# Patient Record
Sex: Male | Born: 1975 | Race: White | Hispanic: No | Marital: Married | Smoking: Former smoker
Health system: Southern US, Community
[De-identification: ages and names within clinical notes are randomized; demographics above are authoritative.]

## PROBLEM LIST (undated history)

## (undated) DIAGNOSIS — Q796 Ehlers-Danlos syndrome, unspecified: Secondary | ICD-10-CM

## (undated) DIAGNOSIS — I10 Essential (primary) hypertension: Secondary | ICD-10-CM

## (undated) DIAGNOSIS — J45909 Unspecified asthma, uncomplicated: Secondary | ICD-10-CM

---

## 2006-11-05 ENCOUNTER — Emergency Department: Payer: Self-pay | Admitting: Unknown Physician Specialty

## 2009-03-05 ENCOUNTER — Encounter (INDEPENDENT_AMBULATORY_CARE_PROVIDER_SITE_OTHER): Payer: Self-pay | Admitting: Orthopedic Surgery

## 2009-03-05 ENCOUNTER — Ambulatory Visit (HOSPITAL_BASED_OUTPATIENT_CLINIC_OR_DEPARTMENT_OTHER): Admission: RE | Admit: 2009-03-05 | Discharge: 2009-03-05 | Payer: Self-pay | Admitting: Orthopedic Surgery

## 2010-03-20 ENCOUNTER — Emergency Department (HOSPITAL_COMMUNITY): Admission: EM | Admit: 2010-03-20 | Discharge: 2010-03-21 | Payer: Self-pay | Admitting: Emergency Medicine

## 2010-03-20 ENCOUNTER — Emergency Department (HOSPITAL_COMMUNITY): Admission: EM | Admit: 2010-03-20 | Discharge: 2010-03-20 | Payer: Self-pay | Admitting: Family Medicine

## 2011-04-11 NOTE — Op Note (Signed)
NAME:  [name], MAN            ACCOUNT NO.:  [id number]   MEDICAL RECORD NO.:  [id number]          PATIENT TYPE:  AMB   LOCATION:  DSC                          FACILITY:  [facility]   PHYSICIAN:  Katy Fitch. Sypher, M.D. 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  Also preoperative plain x-rays demonstrated a lytic lesion in the distal  phalanx, rule out chronic epidermal inclusion cyst versus glomus tumor  versus other expansile lesion.   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   OPERATION:  1. Permanent resection of deformed ulnar nail fold, nail walls and      nail plate.  2. Biopsy of interosseous lesion left long finger distal phalanx.  3. Biopsy of subungual lesion, rule out glomus tumor versus granuloma.   SURGEON:  Katy Fitch. Sypher, MD   ASSISTANT:  None.   ANESTHESIA:  Lidocaine 2% metacarpal head level block left long finger  supplemented by IV sedation.   SUPERVISING ANESTHESIOLOGIST:  Zenon Mayo, MD   INDICATIONS:  Taylor Wilcox is a 35 year old gentleman referred  through the courtesy of Dr. Wynelle Link for evaluation of a chronically deformed  and draining left long finger nail.   In August 2008, [name] crushed the fingertip and was treated at  an emergency facility.  He subsequently developed a very odd nail  deformity and had chronic drainage from beneath the nail fold.  He had  intermittent pain.  He was referred for Hand Surgery consult.  Clinical  examination revealed a very deformed folded nail plate on the ulnar  aspect of the left long finger nail fold.  He had a scar at the dorsal  nail fold, a 180-degree fold in the nail plate  and chronic drainage of  foul material from beneath the nail fold.  An x-ray of the finger  documented a significant area of lysis in the distal phalanx just above  an expansile mass.   We advised proper biopsy of the distal phalanx nail wall, possible  cultures and management of the deformed nail plate as our findings as  Surgery indicated.  After informed consent, [name] was brought  to the operating room at this time.   PROCEDURE:  [name] was brought to the operating room and  placed in supine position upon the operating table.   Following an Anesthesia consult, monitored anesthesia care was  recommended and accepted.  [name] was brought to room 1 of the  [facility] and placed in supine position upon the operating  table and under Dr. Jarrett Ables supervision sedation provided.  The  left hand was prepped with Betadine soap solution and sterilely draped.  A 2% lidocaine metacarpal head level block was placed with a total  volume of 6 mL of 2% lidocaine.  The left long finger was exsanguinated  with a  gauze wrap and a 1/2-inch wide strip of Esmarch bandage used as a  digital tourniquet.  The procedure commenced with a meticulous resection  of the ulnar 25% of the nail plate that included all of the deformed  nail plate, the germinal nail matrix including the dorsal intermediate  and ventral nail fold down to the distal phalanx.  The germinal nail  matrix was in continuity with the osseous lesion.  The osseous lesion  was fairly avascular, encapsulated and likely a form of an epidermal or  dermal inclusion cyst.  The entire deformed nail and cyst was resected  and the biopsy was sent in separate portions with the deformed nail as  one biopsy specimen, the encapsulated bony lesion as a second specimen  in formalin and after elevation of the nail plate, a third shiny mass  was resected that could be a glomus tumor or could represent an atypical   granuloma.   After complete debridement of the nail fold and removal of all nail  elements from the distal phalanx, the wound was partially repaired with  mattress suture of 5-0 chromic.   A drain was placed into the bony defect to allow egress of hematoma and  other effluent.   The wound was dressed open with Xeroflo sterile gauze and a Coban finger  dressing.   There were no apparent complications.   [name] tolerated the surgery and anesthesia well.  He was  awakened from sedation and transferred to the recovery room with stable  signs.   For aftercare, he will be provided prescription for Percocet 5 mg one  p.o. q.4-6 h. p.r.n. pain.  Also, he will be protected with  perioperative antibiotics in the form of doxycycline 100 mg p.o. b.i.d.  x5 days as a prophylactic antibiotic.      Katy Fitch Sypher, M.D.  Electronically Signed     RVS/MEDQ  D:  03/05/2009  T:  03/05/2009  Job:  [id number]   cc:   Dr. Evert Kohl

## 2019-12-10 ENCOUNTER — Other Ambulatory Visit: Payer: Self-pay

## 2019-12-10 ENCOUNTER — Encounter (HOSPITAL_COMMUNITY): Payer: Self-pay

## 2019-12-10 ENCOUNTER — Ambulatory Visit (HOSPITAL_COMMUNITY)
Admission: EM | Admit: 2019-12-10 | Discharge: 2019-12-10 | Disposition: A | Discharge status: Home / Self Care | Payer: 59

## 2019-12-10 DIAGNOSIS — M79602 Pain in left arm: Secondary | ICD-10-CM

## 2019-12-10 HISTORY — DX: Ehlers-Danlos syndrome, unspecified: Q79.60

## 2019-12-10 MED ORDER — NAPROXEN 500 MG PO TABS: 500.0000 mg | ORAL_TABLET | Freq: Two times a day (BID) | ORAL | 0 refills | Status: AC

## 2019-12-10 NOTE — ED Triage Notes (Signed)
Pt c/o right anterior arm pain at antecubital area intermittently x3-4 weeks. Reoccurred yesterday; upon awakening today numbness, tingling, feeling "asleep" up to shoulder; chest muscle area left area feels "tight" with mild lightheadedness 2 hours ago. States cp unchanged with inspiration/movement; "heaviness" remains. Denies n/v or diaphoresis.  EKG performed and result given to Dahlia Byes, NP

## 2019-12-10 NOTE — Discharge Instructions (Addendum)
Your EKG and exam were reassuring today.  Your EKG was normal No concerns for blood clot today. This is most likely some sort of nerve issue Try doing the naproxen twice a day for the next week or so to see if this helps. Avoid repetitive movements with the wrist, hands to see if this helps. Stretching and taking breaks when using the computer may help As we spoke about if your symptoms worsen and you start having worsening chest pain, dizziness, nausea, diaphoresis or shortness of breath you need to go to the ER.

## 2019-12-11 NOTE — ED Provider Notes (Signed)
[facility]    CSN: [id number] Arrival date & time: 12/10/19  1234      History   Chief Complaint Chief Complaint  Patient presents with  . Chest Pain    HPI Taylor Wilcox is a 44 y.o. male.   Patient is an otherwise healthy 44 year old male that presents today with sharp pain in the left antecubital area that has been there intermittently over the last 3 to 4 weeks.  Reoccurred yesterday upon waking and reporting now the pain is radiating up to the left arm, shoulder and into left chest area.  This pain still comes and goes.  He has had some associated numbness, tingling and a sensation of arm falling asleep.  Chest feels tight.  The chest pain is not worsened by movement, breathing.  No associated nausea, vomiting, diaphoresis, shortness of breath.  Has been doing a lot of upper body movements with remodeling of his bathroom recently.  Also works at a computer and types daily.  No swelling to the arm, redness.  No history of DVT or PE.  No recent long distance traveling.  No cough, chest congestion, fevers.  ROS per HPI    Chest Pain   Past Medical History:  Diagnosis Date  . Ehlers-Danlos syndrome     There are no problems to display for this patient.   History reviewed. No pertinent surgical history.     Home Medications    Prior to Admission medications   Medication Sig Start Date End Date Taking? Authorizing Provider  Multiple Vitamin (MULTIVITAMIN) tablet Take 1 tablet by mouth daily.   Yes [provider]  naproxen (NAPROSYN) 500 MG tablet Take 1 tablet (500 mg total) by mouth 2 (two) times daily. 12/10/19   Janace Aris, NP    Family History Family History  Problem Relation Age of Onset  . Aneurysm Mother   . Hyperlipidemia Father   . Hypertension Father     Social History Social History   Tobacco Use  . Smoking status: Former Smoker    Packs/day: 0.50    Years: 10.00    Pack years: 5.00    Quit date: 2010    Years  since quitting: 11.0  . Smokeless tobacco: Never Used  Substance Use Topics  . Alcohol use: Yes    Alcohol/week: 3.0 standard drinks    Types: 3 Glasses of wine per week  . Drug use: Never     Allergies   Patient has no allergy information on record.   Review of Systems Review of Systems  Cardiovascular: Positive for chest pain.     Physical Exam Triage Vital Signs ED Triage Vitals  Enc Vitals Group     BP 12/10/19 1310 (!) 151/102     Pulse Rate 12/10/19 1310 78     Resp 12/10/19 1310 16     Temp 12/10/19 1310 98.3 F (36.8 C)     Temp Source 12/10/19 1310 Oral     SpO2 12/10/19 1310 100 %     Weight --      Height --      Head Circumference --      Peak Flow --      Pain Score 12/10/19 1320 3     Pain Loc --      Pain Edu? --      Excl. in GC? --    No data found.  Updated Vital Signs BP (!) 151/102 (BP Location: Right Arm)   Pulse 78  Temp 98.3 F (36.8 C) (Oral)   Resp 16   SpO2 100%   Visual Acuity Right Eye Distance:   Left Eye Distance:   Bilateral Distance:    Right Eye Near:   Left Eye Near:    Bilateral Near:     Physical Exam Vitals reviewed.  Constitutional:      General: He is not in acute distress.    Appearance: He is well-developed. He is not ill-appearing, toxic-appearing or diaphoretic.  HENT:     Head: Normocephalic.  Cardiovascular:     Rate and Rhythm: Normal rate and regular rhythm.     Heart sounds: Normal heart sounds.  Pulmonary:     Effort: Pulmonary effort is normal.     Breath sounds: Normal breath sounds.  Musculoskeletal:        General: Normal range of motion.     Cervical back: Normal range of motion.     Right lower leg: No tenderness. No edema.     Left lower leg: No tenderness. No edema.     Comments: Arm appears normal in color and without swelling. Good range of motion of shoulder, elbow, wrist Nontender to palpation of chest wall, upper back Mild tenderness to left antecubital area Neurovascular  intact Strength 5 out of 5 in upper extremities   Skin:    General: Skin is warm and dry.  Neurological:     General: No focal deficit present.     Mental Status: He is alert.  Psychiatric:        Mood and Affect: Mood normal.      UC Treatments / Results  Labs (all labs ordered are listed, but only abnormal results are displayed) Labs Reviewed - No data to display  EKG   Radiology No results found.  Procedures Procedures (including critical care time)  Medications Ordered in UC Medications - No data to display  Initial Impression / Assessment and Plan / UC Course  I have reviewed the triage vital signs and the nursing notes.  Pertinent labs & imaging results that were available during my care of the patient were reviewed by me and considered in my medical decision making (see chart for details).     Otherwise healthy 44 year old male presents with this intermittent, waxing and waning sharp pain with associated numbness and tingling in the left arm describes as "weird sensations,". On exam arm appears normal without swelling, redness, bruising. Good range of motion Neurovascularly intact Strength 5 out of 5 in extremity. Based on symptoms did EKG today which showed normal sinus rhythm and normal rate.  No concern for ACS at this time. Not concerned for DVT Most likely patient's pain is musculoskeletal with some associated nerve inflammation/impingment . Treatment ; we will try naproxen twice a day for pain inflammation. If the problem continues he may want to try a burst of steroids.  Recommended avoiding repetitive movements of the wrist and arm to see if this helps. Stretching and taking breaks while using a computer may help Strict return and ER precautions given that if symptoms continue he will need to go to the ER for further evaluation.  Patient understanding and agree. Pt reassured.   Final Clinical Impressions(s) / UC Diagnoses   Final diagnoses:  Pain  of left upper extremity     Discharge Instructions     Your EKG and exam were reassuring today.  Your EKG was normal No concerns for blood clot today. This is most likely some sort of  nerve issue Try doing the naproxen twice a day for the next week or so to see if this helps. Avoid repetitive movements with the wrist, hands to see if this helps. Stretching and taking breaks when using the computer may help As we spoke about if your symptoms worsen and you start having worsening chest pain, dizziness, nausea, diaphoresis or shortness of breath you need to go to the ER.     ED Prescriptions    Medication Sig Dispense Auth. Provider   naproxen (NAPROSYN) 500 MG tablet Take 1 tablet (500 mg total) by mouth 2 (two) times daily. 30 tablet Dahlia Byes A, NP     PDMP not reviewed this encounter.   Janace Aris, NP 12/11/19 1439

## 2020-09-14 ENCOUNTER — Other Ambulatory Visit: Payer: Self-pay | Admitting: Family Medicine

## 2020-09-14 DIAGNOSIS — Q796 Ehlers-Danlos syndrome, unspecified: Secondary | ICD-10-CM

## 2020-09-14 DIAGNOSIS — Z8249 Family history of ischemic heart disease and other diseases of the circulatory system: Secondary | ICD-10-CM

## 2020-09-27 ENCOUNTER — Encounter: Payer: Self-pay | Admitting: Family Medicine

## 2021-09-15 ENCOUNTER — Other Ambulatory Visit: Payer: Self-pay | Admitting: Family Medicine

## 2021-09-15 DIAGNOSIS — Q796 Ehlers-Danlos syndrome, unspecified: Secondary | ICD-10-CM

## 2021-09-15 DIAGNOSIS — Z8249 Family history of ischemic heart disease and other diseases of the circulatory system: Secondary | ICD-10-CM

## 2021-10-24 ENCOUNTER — Ambulatory Visit
Admission: RE | Admit: 2021-10-24 | Discharge: 2021-10-24 | Disposition: A | Discharge status: Home / Self Care | Payer: 59 | Source: Ambulatory Visit | Attending: Family Medicine | Admitting: Family Medicine

## 2021-10-24 ENCOUNTER — Other Ambulatory Visit: Payer: Self-pay

## 2021-10-24 ENCOUNTER — Other Ambulatory Visit: Payer: Self-pay | Admitting: Family Medicine

## 2021-10-24 DIAGNOSIS — Z8249 Family history of ischemic heart disease and other diseases of the circulatory system: Secondary | ICD-10-CM

## 2021-10-24 DIAGNOSIS — Q796 Ehlers-Danlos syndrome, unspecified: Secondary | ICD-10-CM

## 2022-12-04 IMAGING — MR MR MRA HEAD W/O CM
1 series · 10 of 48 positions shown · non-contrast
Comparison: None.

CLINICAL DATA: Ehlers-Danlos syndrome. Family history of cerebral
aneurysm.

EXAM:
MRI HEAD WITHOUT CONTRAST
MRA HEAD WITHOUT CONTRAST
TECHNIQUE: Multiplanar, multi-echo pulse sequences of the brain and surrounding
structures were acquired without intravenous contrast. Angiographic
images of the Circle of Willis were acquired using MRA technique
without intravenous contrast.

[Series 5: [id number] · axial · 0.6mm · 0.26mm/px · z∈[-56,+20]mm · 10 of 162 slices shown]
[im 11/162]
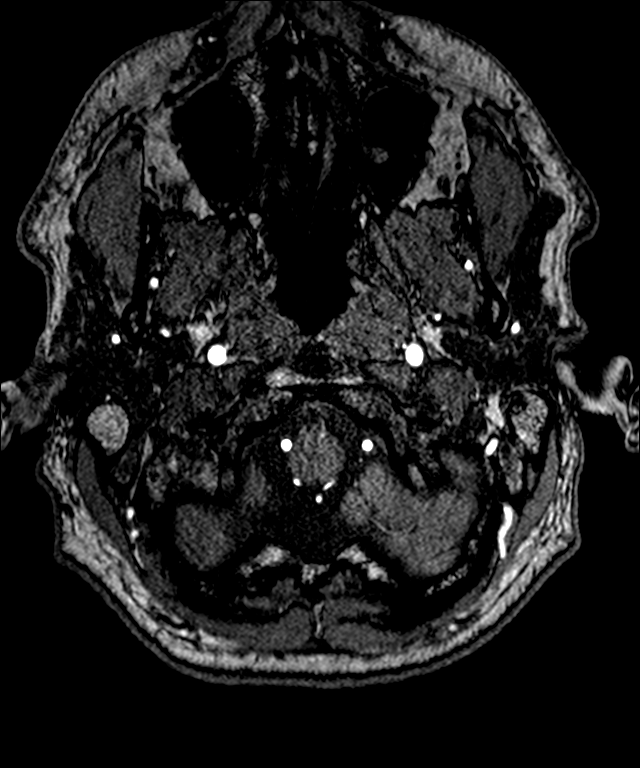
[im 28/162]
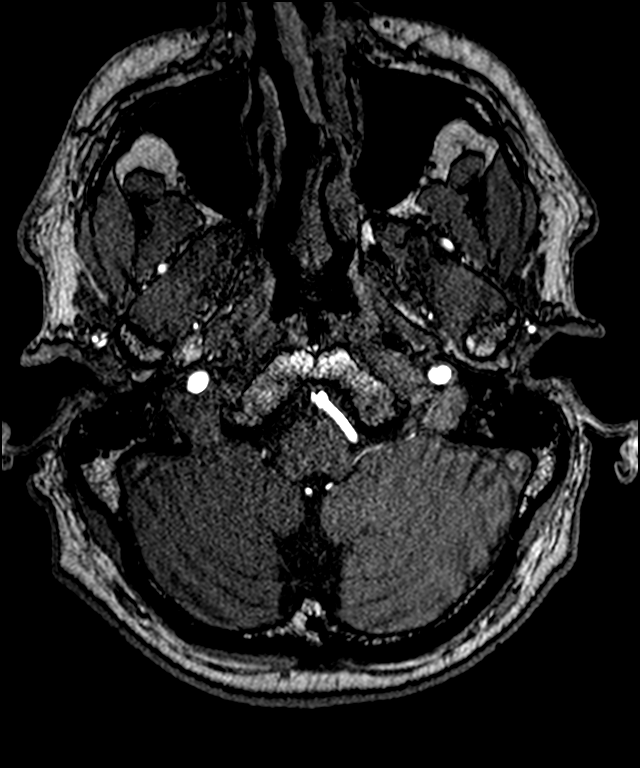
[im 31/162]
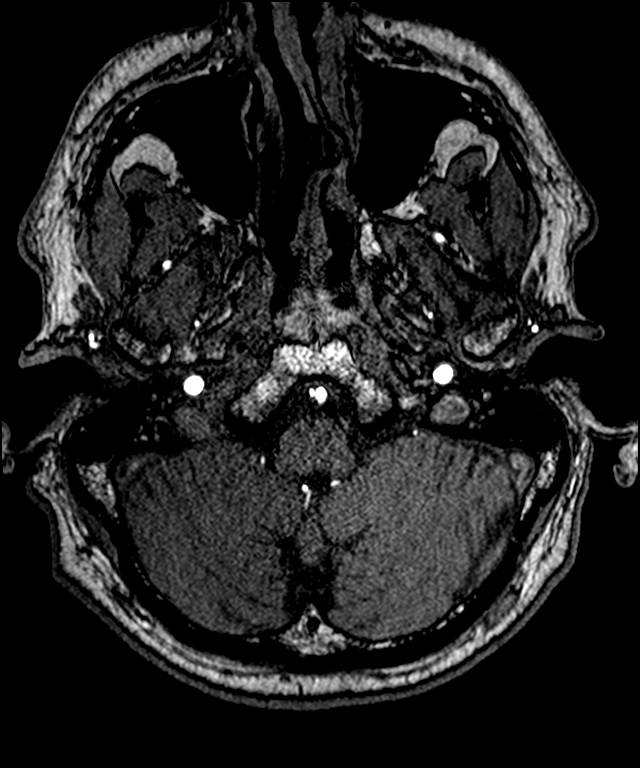
[im 52/162]
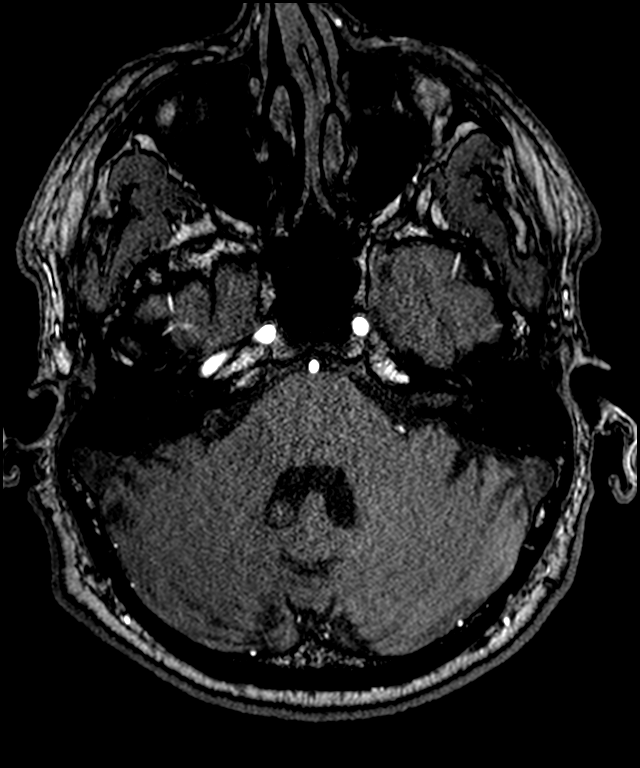
[im 72/162]
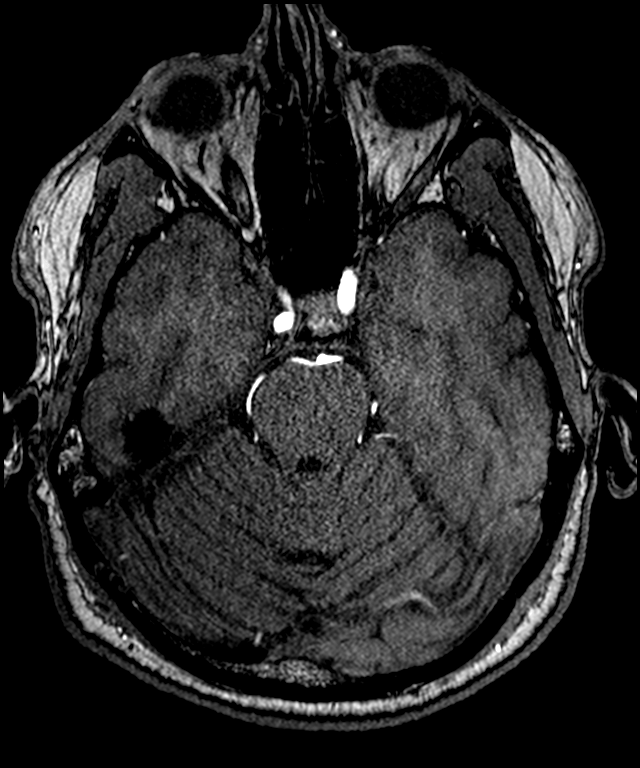
[im 83/162]
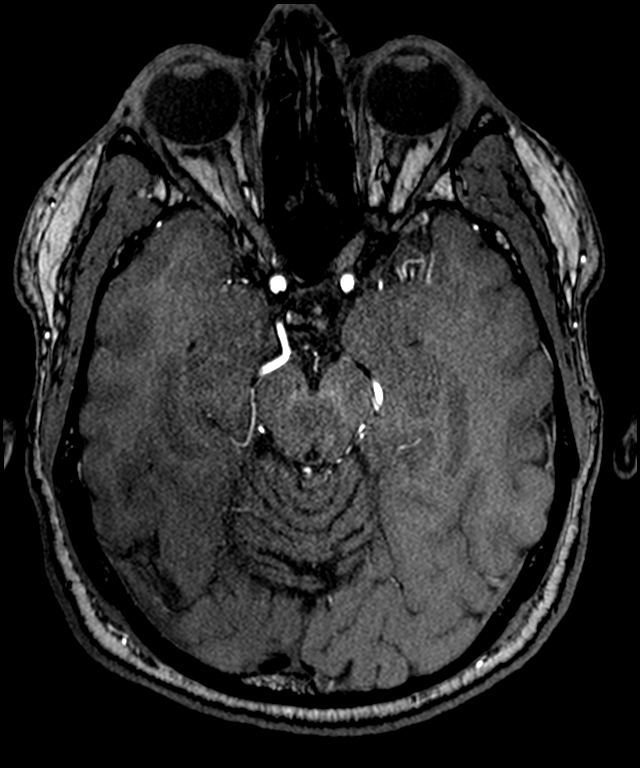
[im 93/162]
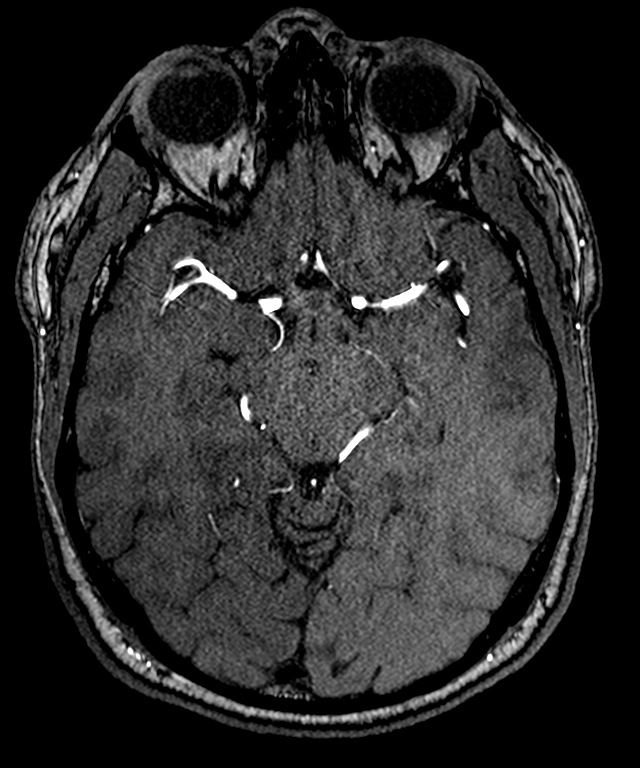
[im 114/162]
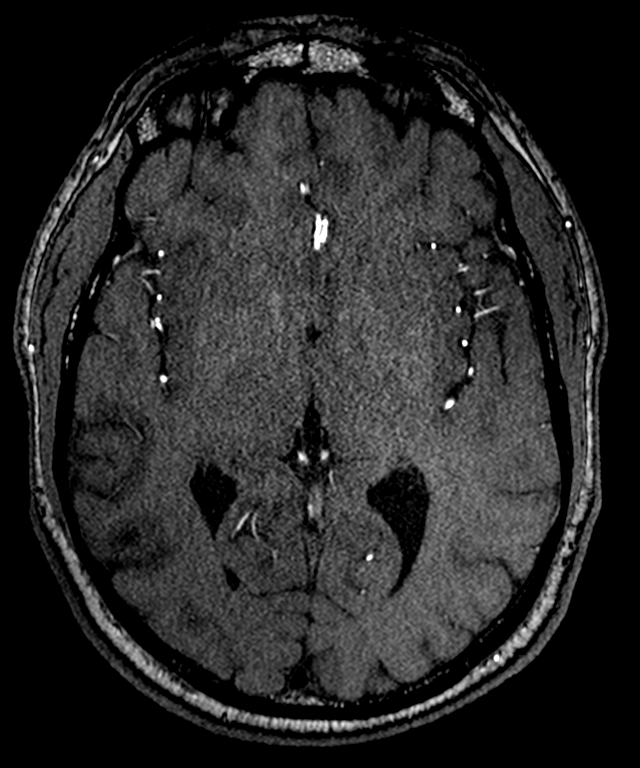
[im 134/162]
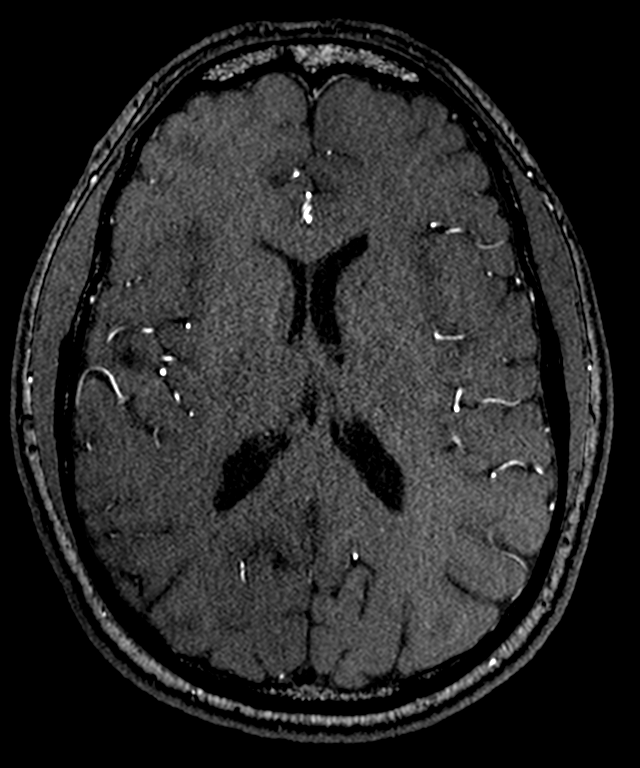
[im 138/162]
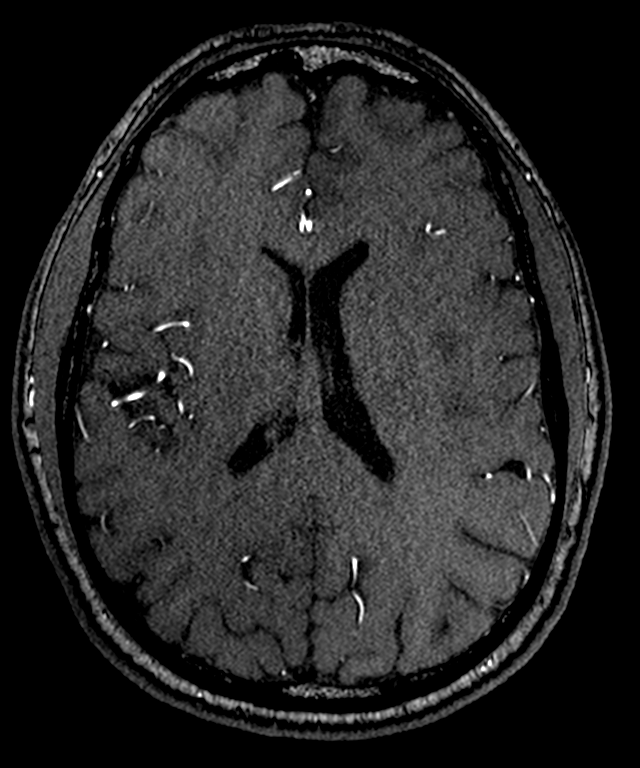

[10 of 48 positions shown; findings below may reference images not displayed]

FINDINGS: MRI HEAD FINDINGS

Brain: There is no evidence of an acute infarct, intracranial
hemorrhage, mass, midline shift, or extra-axial fluid collection.
The ventricles and sulci are normal. Scattered small T2
hyperintensities in the cerebral white matter bilaterally,
predominantly in the subcortical white matter of the frontal lobes,
are slightly advanced for age.

Vascular: Major intracranial vascular flow voids are preserved.

Skull and upper cervical spine: Unremarkable bone marrow signal.
Asymmetrically advanced right-sided facet arthrosis at C3-4 with
grade 1 anterolisthesis.

Sinuses/Orbits: Unremarkable orbits. Clear paranasal sinuses. Trace
left mastoid fluid.

Other: None.

MRA HEAD FINDINGS

Anterior circulation: The internal carotid arteries are widely
patent from skull base to carotid termini. ACAs and MCAs are patent
without evidence of a proximal branch occlusion or significant
proximal stenosis. No aneurysm is identified.

Posterior circulation: The included intracranial portions of both
vertebral arteries are widely patent to the basilar and codominant.
The right PICA and left AICA appear dominant. Patent SCAs are seen
bilaterally. The basilar artery is widely patent with an incidental
fenestration proximally. There are large right and small or absent
left posterior communicating arteries with hypoplasia of the right
P1 segment. Both PCAs are patent without evidence of a significant
proximal stenosis. No aneurysm is identified.

Anatomic variants: Fetal right PCA.
IMPRESSION: 1. No acute intracranial abnormality.
2. Mild cerebral white matter T2 hyperintensities, nonspecific but
may reflect early chronic small vessel ischemia, migraines, or prior
infection/inflammation.
3. Negative head MRA.  No intracranial aneurysm identified.

## 2023-08-28 DIAGNOSIS — W57XXXA Bitten or stung by nonvenomous insect and other nonvenomous arthropods, initial encounter: Secondary | ICD-10-CM | POA: Diagnosis not present

## 2023-08-28 DIAGNOSIS — S80861A Insect bite (nonvenomous), right lower leg, initial encounter: Secondary | ICD-10-CM | POA: Diagnosis not present

## 2023-09-20 DIAGNOSIS — Z113 Encounter for screening for infections with a predominantly sexual mode of transmission: Secondary | ICD-10-CM | POA: Diagnosis not present

## 2023-10-30 DIAGNOSIS — Z13220 Encounter for screening for lipoid disorders: Secondary | ICD-10-CM | POA: Diagnosis not present

## 2023-10-30 DIAGNOSIS — Z1159 Encounter for screening for other viral diseases: Secondary | ICD-10-CM | POA: Diagnosis not present

## 2023-10-30 DIAGNOSIS — J3089 Other allergic rhinitis: Secondary | ICD-10-CM | POA: Diagnosis not present

## 2023-10-30 DIAGNOSIS — Z23 Encounter for immunization: Secondary | ICD-10-CM | POA: Diagnosis not present

## 2023-10-30 DIAGNOSIS — Z0000 Encounter for general adult medical examination without abnormal findings: Secondary | ICD-10-CM | POA: Diagnosis not present

## 2023-10-30 DIAGNOSIS — R030 Elevated blood-pressure reading, without diagnosis of hypertension: Secondary | ICD-10-CM | POA: Diagnosis not present

## 2024-09-09 DIAGNOSIS — R0609 Other forms of dyspnea: Secondary | ICD-10-CM | POA: Diagnosis not present

## 2024-09-09 DIAGNOSIS — J3089 Other allergic rhinitis: Secondary | ICD-10-CM | POA: Diagnosis not present

## 2024-09-09 DIAGNOSIS — R030 Elevated blood-pressure reading, without diagnosis of hypertension: Secondary | ICD-10-CM | POA: Diagnosis not present

## 2024-10-07 ENCOUNTER — Ambulatory Visit: Payer: Self-pay

## 2024-10-07 ENCOUNTER — Ambulatory Visit (INDEPENDENT_AMBULATORY_CARE_PROVIDER_SITE_OTHER)

## 2024-10-07 VITALS — BP 127/88 | HR 64 | Temp 98.7°F

## 2024-10-07 DIAGNOSIS — J4520 Mild intermittent asthma, uncomplicated: Secondary | ICD-10-CM | POA: Diagnosis not present

## 2024-10-07 LAB — CBC WITH DIFFERENTIAL/PLATELET
Basophils Absolute: 0.1 K/uL (ref 0.0–0.1)
Basophils Relative: 0.9 % (ref 0.0–3.0)
Eosinophils Absolute: 0.3 K/uL (ref 0.0–0.7)
Eosinophils Relative: 4.3 % (ref 0.0–5.0)
HCT: 46.9 % (ref 39.0–52.0)
Hemoglobin: 16 g/dL (ref 13.0–17.0)
Lymphocytes Relative: 28.8 % (ref 12.0–46.0)
Lymphs Abs: 2 K/uL (ref 0.7–4.0)
MCHC: 34.2 g/dL (ref 30.0–36.0)
MCV: 91.6 fl (ref 78.0–100.0)
Monocytes Absolute: 0.5 K/uL (ref 0.1–1.0)
Monocytes Relative: 6.8 % (ref 3.0–12.0)
Neutro Abs: 4.2 K/uL (ref 1.4–7.7)
Neutrophils Relative %: 59.2 % (ref 43.0–77.0)
Platelets: 289 K/uL (ref 150.0–400.0)
RBC: 5.12 Mil/uL (ref 4.22–5.81)
RDW: 13.4 % (ref 11.5–15.5)
WBC: 7 K/uL (ref 4.0–10.5)

## 2024-10-07 MED ORDER — ALBUTEROL SULFATE HFA 108 (90 BASE) MCG/ACT IN AERS: 1.0000 | INHALATION_SPRAY | Freq: Four times a day (QID) | RESPIRATORY_TRACT | 6 refills | Status: AC | PRN

## 2024-10-07 MED ORDER — BUDESONIDE-FORMOTEROL FUMARATE 80-4.5 MCG/ACT IN AERO: 2.0000 | INHALATION_SPRAY | Freq: Two times a day (BID) | RESPIRATORY_TRACT | 6 refills | Status: AC

## 2024-10-07 MED ORDER — AEROCHAMBER MV MISC: 0 refills | Status: AC

## 2024-10-07 NOTE — Progress Notes (Addendum)
 New Patient Pulmonology Office Visit   Subjective:  Patient ID: Taylor Wilcox, male    DOB: 1976-10-29  MRN: [id number]  Referred by: Rolinda Millman, MD  CC:  Chief Complaint  Patient presents with   Consult    Discussed the use of AI scribe software for clinical note transcription with the patient, who gave verbal consent to proceed.  History of Present Illness Taylor Wilcox is a 48 year old male who presents with worsening shortness of breath and wheezing during exercise and allergy attacks.  He initially experienced difficulty breathing during exercise about a year ago, which improved somewhat with montelukast 10 mg. His symptoms worsened following COVID-19 in July and another viral infection in late summer. Since then, he has had increased wheezing and dyspnea during exercise and allergy attacks. Symbicort, started three weeks ago at the lowest dose, has provided more relief than montelukast, but he still experiences nocturnal wheezing, especially after allergen exposure. He does not regularly use an albuterol inhaler. Exercise tolerance varies with allergy severity. He has seasonal allergies in spring and fall but no asthma history. A chest x-ray was reportedly clear.    ROS as above  Allergies: Patient has no allergy information on record.  Current Outpatient Medications:    Multiple Vitamin (MULTIVITAMIN) tablet, Take 1 tablet by mouth daily., Disp: , Rfl:    naproxen  (NAPROSYN ) 500 MG tablet, Take 1 tablet (500 mg total) by mouth 2 (two) times daily., Disp: 30 tablet, Rfl: 0 Past Medical History:  Diagnosis Date   Ehlers-Danlos syndrome    No past surgical history on file. Family History  Problem Relation Age of Onset   Aneurysm Mother    Hyperlipidemia Father    Hypertension Father    Social History   Socioeconomic History   Marital status: Married    Spouse name: Not on file   Number of children: Not on file   Years of education: Not on file   Highest  education level: Not on file  Occupational History   Not on file  Tobacco Use   Smoking status: Former    Current packs/day: 0.00    Average packs/day: 0.5 packs/day for 10.0 years (5.0 ttl pk-yrs)    Types: Cigarettes    Start date: 2000    Quit date: 2010    Years since quitting: 15.8   Smokeless tobacco: Never  Substance and Sexual Activity   Alcohol use: Yes    Alcohol/week: 3.0 standard drinks of alcohol    Types: 3 Glasses of wine per week   Drug use: Never   Sexual activity: Not on file  Other Topics Concern   Not on file  Social History Narrative   Not on file   Social Drivers of Health   Financial Resource Strain: Not on file  Food Insecurity: Not on file  Transportation Needs: Not on file  Physical Activity: Not on file  Stress: Not on file  Social Connections: Not on file  Intimate Partner Violence: Not on file       Objective:  There were no vitals taken for this visit. Wt Readings from Last 3 Encounters:  No data found for Wt   BMI Readings from Last 3 Encounters:  No data found for BMI   SpO2 Readings from Last 3 Encounters:  10/07/24 94%  12/10/19 100%   Physical examination: Gen: no in acute distress NEENT: normal mucosa Pulm: no wheezing, no resp distress. Normal lung sounds CV RRR, no murmur Neuro: AOX3, no  focalization  CXR 09/10/24 : no acute findings.    Assessment & Plan:   Assessment & Plan Mild persistent Asthma  Asthma exacerbated by recent viral infections and allergies, with partial improvement on montelukast and Symbicort. Normal chest x-ray. Long-term therapy required.  - Increased Symbicort 80 to two puffs BID. Rinse mouth after use. - Continue montelukast. - Use albuterol inhaler PRN, especially pre-exercise, 30 minutes prior - Provided spacer for inhaler use. - Ordered PFT in three months. - CBC, Allergy panel - Provided asthma and inhaler use education. - On montelukast  Device Education Patient requires  education and evaluation of inhaler/nebulizer technique due to new device  Diagnosis: asthma   Education provided: Reviewed and demonstrated proper setup, medication loading, and/or operation of symbicort. Instructed on inhalation technique, cleaning, and maintenance. Patient/caregiver performed return-demonstration; technique evaluated and corrected as needed.  Will reassess at follow-up.    Return in about 3 months (around 01/07/2025).    Marny Patch, MD Pulmonary and Critical Care Medicine [facility]

## 2024-10-07 NOTE — Patient Instructions (Addendum)
 Dear Taylor Wilcox;  Based on your symptoms, it is most likely you have asthma. I will recommend the following: -Symbicort 2 puffs twice a day. Rinse your mouth after each use. -Albuterol as need it for shortness of breath -Pulmonary function test at the next appointment.  I will see you in 3 months.  INHALER INSTRUCTIONS: To use the inhaler you follow these steps: Prime the inhaler according to instructions (which means waste between 1-4 doses before next use).  Follow package insert Shake the inhaler before each use Connect spacer Exhale completely by blowing all the air out of your lungs Seal your mouth around the spacer Press down on the canister then inhale SLOW and STEADY until your fill your lungs completely. Hold the breath for 6-10 seconds Gently exhale Wait 60 seconds then repeat steps 2-8. Rinse the mouth after each use to prevent thrush  Your pharmacist can also review proper technique if you have any remaining questions. Let me know if the cost is too high, your insurance may be able to recommend a more affordable option for you.

## 2024-10-08 LAB — RESPIRATORY ALLERGY PANEL REGION II W/ RFLX: ~~LOC~~
Allergen, A. alternata, m6: 1.52 kU/L — ABNORMAL HIGH
Allergen, Cedar tree, t12: <0.1 kU/L
Allergen, Comm Silver Birch, t9: 0.2 kU/L — ABNORMAL HIGH
Allergen, Cottonwood, t14: <0.1 kU/L
Allergen, D pternoyssinus,d7: 11 kU/L — ABNORMAL HIGH
Allergen, Mouse Urine Protein, e78: <0.1 kU/L
Allergen, Mulberry, t76: <0.1 kU/L
Allergen, Oak,t7: 0.11 kU/L — ABNORMAL HIGH
Allergen, P. notatum, m1: <0.1 kU/L
Aspergillus fumigatus, m3: <0.1 kU/L
Bermuda Grass: <0.1 kU/L
Box Elder IgE: <0.1 kU/L
CLADOSPORIUM HERBARUM (M2) IGE: <0.1 kU/L
COMMON RAGWEED (SHORT) (W1) IGE: 0.35 kU/L — ABNORMAL HIGH
Cat Dander: <0.1 kU/L
Class: 0
Class: 0
Class: 0
Class: 0
Class: 0
Class: 0
Class: 0
Class: 0
Class: 0
Class: 0
Class: 0
Class: 0
Class: 0
Class: 0
Class: 0
Class: 0
Class: 0
Class: 0
Class: 1
Class: 2
Class: 3
Class: 3
Cockroach: <0.1 kU/L
D. farinae: 9.13 kU/L — ABNORMAL HIGH
Dog Dander: <0.1 kU/L
Elm IgE: <0.1 kU/L
IgE (Immunoglobulin E), Serum: 58 kU/L (ref <=114)
Johnson Grass: <0.1 kU/L
Pecan/Hickory Tree IgE: <0.1 kU/L
Rough Pigweed  IgE: <0.1 kU/L
Sheep Sorrel IgE: <0.1 kU/L
Timothy Grass: <0.1 kU/L

## 2024-10-08 LAB — INTERPRETATION:

## 2024-12-17 ENCOUNTER — Other Ambulatory Visit (HOSPITAL_COMMUNITY): Payer: Self-pay

## 2025-01-02 ENCOUNTER — Encounter (HOSPITAL_COMMUNITY): Payer: Self-pay

## 2025-01-02 ENCOUNTER — Emergency Department (HOSPITAL_COMMUNITY): Admission: EM | Admit: 2025-01-02 | Source: Home / Self Care

## 2025-01-02 ENCOUNTER — Emergency Department (HOSPITAL_COMMUNITY)

## 2025-01-02 ENCOUNTER — Other Ambulatory Visit: Payer: Self-pay

## 2025-01-02 HISTORY — DX: Unspecified asthma, uncomplicated: J45.909

## 2025-01-02 HISTORY — DX: Essential (primary) hypertension: I10

## 2025-01-02 LAB — CBC
HCT: 51 % (ref 39.0–52.0)
Hemoglobin: 17.2 g/dL — ABNORMAL HIGH (ref 13.0–17.0)
MCH: 30.9 pg (ref 26.0–34.0)
MCHC: 33.7 g/dL (ref 30.0–36.0)
MCV: 91.6 fL (ref 80.0–100.0)
Platelets: 218 10*3/uL (ref 150–400)
RBC: 5.57 MIL/uL (ref 4.22–5.81)
RDW: 13.1 % (ref 11.5–15.5)
WBC: 15.8 10*3/uL — ABNORMAL HIGH (ref 4.0–10.5)
nRBC: 0 % (ref 0.0–0.2)

## 2025-01-02 LAB — RESP PANEL BY RT-PCR (RSV, FLU A&B, COVID)  RVPGX2
Influenza A by PCR: NEGATIVE
Influenza B by PCR: NEGATIVE
Resp Syncytial Virus by PCR: NEGATIVE
SARS Coronavirus 2 by RT PCR: NEGATIVE

## 2025-01-02 LAB — BASIC METABOLIC PANEL WITH GFR
Anion gap: 15 (ref 5–15)
BUN: 12 mg/dL (ref 6–20)
CO2: 20 mmol/L — ABNORMAL LOW (ref 22–32)
Calcium: 10 mg/dL (ref 8.9–10.3)
Chloride: 102 mmol/L (ref 98–111)
Creatinine, Ser: 1.3 mg/dL — ABNORMAL HIGH (ref 0.61–1.24)
GFR, Estimated: >60 mL/min
Glucose, Bld: 105 mg/dL — ABNORMAL HIGH (ref 70–99)
Potassium: 4.4 mmol/L (ref 3.5–5.1)
Sodium: 137 mmol/L (ref 135–145)

## 2025-01-02 LAB — I-STAT CG4 LACTIC ACID, ED: Lactic Acid, Venous: 1.8 mmol/L (ref 0.5–1.9)

## 2025-01-02 LAB — TROPONIN T, HIGH SENSITIVITY: Troponin T High Sensitivity: 231 ng/L (ref 0–19)

## 2025-01-02 LAB — D-DIMER, QUANTITATIVE: D-Dimer, Quant: >20 ug{FEU}/mL — ABNORMAL HIGH (ref 0.00–0.50)

## 2025-01-02 MED ORDER — SODIUM CHLORIDE 0.9 % IV BOLUS
1000.0000 mL | Freq: Once | INTRAVENOUS | Status: AC
  Administered 2025-01-02: 1000 mL via INTRAVENOUS

## 2025-01-02 MED ORDER — HEPARIN BOLUS VIA INFUSION
6000.0000 [IU] | Freq: Once | INTRAVENOUS | Status: AC
  Administered 2025-01-02: 6000 [IU] via INTRAVENOUS
  Filled 2025-01-02: qty 6000

## 2025-01-02 MED ORDER — HEPARIN (PORCINE) 25000 UT/250ML-% IV SOLN
1300.0000 [IU]/h | INTRAVENOUS | Status: AC
  Administered 2025-01-02: 1300 [IU]/h via INTRAVENOUS
  Filled 2025-01-02: qty 250

## 2025-01-02 MED ORDER — IOHEXOL 350 MG/ML SOLN
75.0000 mL | Freq: Once | INTRAVENOUS | Status: AC | PRN
  Administered 2025-01-02: 75 mL via INTRAVENOUS

## 2025-01-02 NOTE — ED Triage Notes (Signed)
 Patient arrives POV for shortness of breath. Patient endorses dyspnea is worse with exertion. Testing for asthma via PCP. Patient did home test for covid flu rsv which were all negative. Patient with tachypnea at quick triage.

## 2025-01-02 NOTE — ED Notes (Signed)
 Patient transported to X-ray

## 2025-01-02 NOTE — ED Provider Notes (Incomplete)
 " [place] [facility] Provider Note   CSN: [id number] Arrival date & time: 01/02/25  8169     Patient presents with: Shortness of Breath   Taylor Wilcox is a 49 y.o. male with history of asthma, Ehlers Danlos syndrome, hypertension.  Presents to ED complaining of shortness of breath, chest tightness about his sternum.  States that he woke up this morning feeling very short of breath.  States that throughout the course of the day he has become more short of breath, becomes very winded when he walks short distances which is very new for him.  He reports he also developed chest tightness located about his sternum this morning without radiation.  He reports that last night he had a fever which seemed to break overnight.  He denies any recent nausea, vomiting or abdominal pain.  Denies any recent sick contacts or travel.  He denies any recent leg swelling, history of blood clots.  Reports that for the last year or so he has been seeing pulmonology due to increasing shortness of breath.  Reports all this began when he had COVID about a year and a half ago.    Shortness of Breath      Prior to Admission medications  Medication Sig Start Date End Date Taking? Authorizing Provider  albuterol  (VENTOLIN  HFA) 108 (90 Base) MCG/ACT inhaler Inhale 1-2 puffs into the lungs every 6 (six) hours as needed for wheezing or shortness of breath. 10/07/24 11/06/24  Adrien Winfred Berke, MD  budesonide -formoterol  (SYMBICORT ) 80-4.5 MCG/ACT inhaler Inhale 2 puffs into the lungs 2 (two) times daily. 10/07/24 11/06/24  Adrien Winfred Berke, MD  Multiple Vitamin (MULTIVITAMIN) tablet Take 1 tablet by mouth daily.    [provider]  naproxen  (NAPROSYN ) 500 MG tablet Take 1 tablet (500 mg total) by mouth 2 (two) times daily. 12/10/19   Adah Wilbert LABOR, FNP  Spacer/Aero-Holding Chambers (AEROCHAMBER MV) inhaler Use as instructed 10/07/24   Adrien Winfred, Berke, MD     Allergies: Patient has no known allergies.    Review of Systems  Respiratory:  Positive for shortness of breath.     Updated Vital Signs BP 116/89 (BP Location: Left Arm)   Pulse (!) 136   Temp 99 F (37.2 C)   Resp (!) 24   Ht 5' 9 (1.753 m)   Wt 81.6 kg   SpO2 92%   BMI 26.58 kg/m   Physical Exam  (all labs ordered are listed, but only abnormal results are displayed) Labs Reviewed  BASIC METABOLIC PANEL WITH GFR - Abnormal; Notable for the following components:      Result Value   CO2 20 (*)    Glucose, Bld 105 (*)    Creatinine, Ser 1.30 (*)    All other components within normal limits  CBC - Abnormal; Notable for the following components:   WBC 15.8 (*)    Hemoglobin 17.2 (*)    All other components within normal limits  D-DIMER, QUANTITATIVE (NOT AT [facility]) - Abnormal; Notable for the following components:   D-Dimer, Quant >20.00 (*)    All other components within normal limits  RESP PANEL BY RT-PCR (RSV, FLU A&B, COVID)  RVPGX2  I-STAT CG4 LACTIC ACID, ED  TROPONIN T, HIGH SENSITIVITY    EKG: None  Radiology: DG Chest 2 View Result Date: 01/02/2025 CLINICAL DATA:  Shortness of breath EXAM: CHEST - 2 VIEW COMPARISON:  09/09/2024 FINDINGS: The heart size and mediastinal contours are within normal  limits. Apical pleuroparenchymal scarring. Both lungs are clear. The visualized skeletal structures are unremarkable. IMPRESSION: No active cardiopulmonary disease. Electronically Signed   By: Luke Bun M.D.   On: 01/02/2025 19:36    {Document cardiac monitor, telemetry assessment procedure when appropriate:32947} Procedures   Medications Ordered in the ED  sodium chloride  0.9 % bolus 1,000 mL (has no administration in time range)      {Click here for ABCD2, HEART and other calculators REFRESH Note before signing:1}                              Medical Decision Making Amount and/or Complexity of Data Reviewed Radiology: ordered.   ***  {Document  critical care time when appropriate  Document review of labs and clinical decision tools ie CHADS2VASC2, etc  Document your independent review of radiology images and any outside records  Document your discussion with family members, caretakers and with consultants  Document social determinants of health affecting pt's care  Document your decision making why or why not admission, treatments were needed:32947:::1}   Final diagnoses:  None    ED Discharge Orders     None        "

## 2025-01-02 NOTE — ED Provider Triage Note (Signed)
 Emergency Medicine Provider Triage Evaluation Note  Taylor Wilcox , a 49 y.o. male  was evaluated in triage.  Pt complains of this of breath.  Patient reports that he has began having shortness of breath early this morning/late last night.  He has been having some pulmonary issues that are being followed by a pulmonologist.  However he reports that starting last night he has had a thing worsening shortness of breath.  He did a home COVID flu RSV test that came back negative.  He does not know if he has been or anybody sick recently.  He denies any recent travel.  He denies any smoking.  On physical exam there is no reproducible pain.  He denies any chest pain  Review of Systems  Positive: Shortness of breath Negative:   Physical Exam  BP 129/85 (BP Location: Right Arm)   Pulse (!) 140   Temp 98.6 F (37 C) (Oral)   Resp (!) 26   Ht 5' 9 (1.753 m)   Wt 81.6 kg   SpO2 96%   BMI 26.58 kg/m  Gen:   Awake, no distress   Resp:  Normal effort  MSK:   Moves extremities without difficulty  Other:    Medical Decision Making  Medically screening exam initiated at 7:50 PM.  Appropriate orders placed.  Taylor Wilcox was informed that the remainder of the evaluation will be completed by another provider, this initial triage assessment does not replace that evaluation, and the importance of remaining in the ED until their evaluation is complete.     Taylor Wilcox, [place] 01/02/25 1952

## 2025-01-03 NOTE — ED Notes (Signed)
 Patient and support person informed that patient is to remain NPO until intensivist consult advises otherwise.

## 2025-01-08 ENCOUNTER — Ambulatory Visit

## 2025-01-08 ENCOUNTER — Encounter
